# Patient Record
Sex: Male | Born: 1955 | Race: White | Hispanic: No | Marital: Married | Smoking: Never smoker
Health system: Southern US, Community
[De-identification: ages and names within clinical notes are randomized; demographics above are authoritative.]

## PROBLEM LIST (undated history)

## (undated) DIAGNOSIS — M1990 Unspecified osteoarthritis, unspecified site: Secondary | ICD-10-CM

## (undated) DIAGNOSIS — K635 Polyp of colon: Secondary | ICD-10-CM

## (undated) DIAGNOSIS — K219 Gastro-esophageal reflux disease without esophagitis: Secondary | ICD-10-CM

## (undated) HISTORY — PX: COLONOSCOPY: SHX174

## (undated) HISTORY — PX: CHOLECYSTECTOMY: SHX55

---

## 2005-10-18 ENCOUNTER — Ambulatory Visit: Payer: Self-pay | Admitting: Gastroenterology

## 2007-03-26 ENCOUNTER — Ambulatory Visit: Payer: Self-pay | Admitting: Physician Assistant

## 2007-07-16 ENCOUNTER — Ambulatory Visit: Payer: Self-pay | Admitting: Internal Medicine

## 2008-04-19 ENCOUNTER — Ambulatory Visit: Payer: Self-pay | Admitting: Internal Medicine

## 2010-09-28 ENCOUNTER — Ambulatory Visit: Payer: Self-pay | Admitting: Physician Assistant

## 2010-10-02 ENCOUNTER — Other Ambulatory Visit: Payer: Self-pay | Admitting: Sports Medicine

## 2012-04-24 ENCOUNTER — Ambulatory Visit: Payer: Self-pay | Admitting: Rheumatology

## 2012-05-04 ENCOUNTER — Ambulatory Visit: Payer: Self-pay | Admitting: Gastroenterology

## 2016-09-25 ENCOUNTER — Ambulatory Visit
Admission: EM | Admit: 2016-09-25 | Discharge: 2016-09-25 | Disposition: A | Discharge status: Home / Self Care | Payer: 59 | Attending: Family Medicine | Admitting: Family Medicine

## 2016-09-25 DIAGNOSIS — H00011 Hordeolum externum right upper eyelid: Secondary | ICD-10-CM | POA: Diagnosis not present

## 2016-09-25 DIAGNOSIS — H00031 Abscess of right upper eyelid: Secondary | ICD-10-CM | POA: Diagnosis not present

## 2016-09-25 DIAGNOSIS — H00033 Abscess of eyelid right eye, unspecified eyelid: Secondary | ICD-10-CM

## 2016-09-25 MED ORDER — CEPHALEXIN 500 MG PO CAPS: 500.0000 mg | ORAL_CAPSULE | Freq: Three times a day (TID) | ORAL | 0 refills | Status: DC

## 2016-09-25 NOTE — ED Triage Notes (Signed)
Patient complains of right eye pain that occurred after mowing his grass on Friday pm. Patient states that eye has been bothering him and he has been noticing swelling getting worse. Patient states he has been trying warm compresses and pollen eye drops.

## 2016-09-25 NOTE — ED Provider Notes (Signed)
[facility]    CSN: [id number] Arrival date & time: 09/25/16  1201     History   Chief Complaint Chief Complaint  Patient presents with  . Eye Pain    right    HPI Jose Moran is a 61 y.o. male.   The history is provided by the patient.  Eye Pain  Pertinent negatives include no headaches.  Eye Problem  Location:  Right eye Quality:  Aching Severity:  Moderate Onset quality:  Sudden Duration:  5 days Timing:  Constant Progression:  Worsening Chronicity:  New Context: not burn, not chemical exposure, not contact lens problem, not direct trauma, not foreign body, not using machinery, not scratch, not smoke exposure and not UV exposure   Relieved by:  Nothing Ineffective treatments:  Flushing and eye drops Associated symptoms: redness and swelling   Associated symptoms: no blurred vision, no crusting, no decreased vision, no discharge, no double vision, no facial rash, no headaches, no inflammation, no itching, no nausea, no numbness, no photophobia, no scotomas, no tearing, no tingling, no vomiting and no weakness   Risk factors: no conjunctival hemorrhage, no exposure to pinkeye, no previous injury to eye, no recent herpes zoster and no recent URI     History reviewed. No pertinent past medical history.  There are no active problems to display for this patient.   Past Surgical History:  Procedure Laterality Date  . CHOLECYSTECTOMY         Home Medications    Prior to Admission medications   Medication Sig Start Date End Date Taking? Authorizing Provider  aspirin EC 81 MG tablet Take 81 mg by mouth daily.   Yes [provider]  Multiple Vitamins-Minerals (MULTIVITAMIN WITH MINERALS) tablet Take 1 tablet by mouth daily.   Yes [provider]  cephALEXin (KEFLEX) 500 MG capsule Take 1 capsule (500 mg total) by mouth 3 (three) times daily. 09/25/16   Norval Gable, MD    Family History History reviewed. No pertinent family  history.  Social History Social History  Substance Use Topics  . Smoking status: Never Smoker  . Smokeless tobacco: Never Used  . Alcohol use Yes     Comment: rarely     Allergies   Patient has no known allergies.   Review of Systems Review of Systems  Eyes: Positive for pain and redness. Negative for blurred vision, double vision, photophobia, discharge and itching.  Gastrointestinal: Negative for nausea and vomiting.  Neurological: Negative for tingling, weakness, numbness and headaches.     Physical Exam Triage Vital Signs ED Triage Vitals  Enc Vitals Group     BP 09/25/16 1235 122/73     Pulse Rate 09/25/16 1235 73     Resp 09/25/16 1235 18     Temp 09/25/16 1235 97.6 F (36.4 C)     Temp Source 09/25/16 1235 Oral     SpO2 09/25/16 1235 100 %     Weight 09/25/16 1233 175 lb (79.4 kg)     Height 09/25/16 1233 5\' 11"  (1.803 m)     Head Circumference --      Peak Flow --      Pain Score 09/25/16 1233 1     Pain Loc --      Pain Edu? --      Excl. in [place]? --    No data found.   Updated Vital Signs BP 122/73 (BP Location: Left Arm)   Pulse 73   Temp 97.6 F (36.4  C) (Oral)   Resp 18   Ht 5\' 11"  (1.803 m)   Wt 175 lb (79.4 kg)   SpO2 100%   BMI 24.41 kg/m   Visual Acuity Right Eye Distance: 20/20 (uncorrected) Left Eye Distance: 20/20 (uncorrected) Bilateral Distance: 20/13 (uncorrected)  Right Eye Near:   Left Eye Near:    Bilateral Near:     Physical Exam  Constitutional: He appears well-developed and well-nourished. No distress.  HENT:  Head: Normocephalic and atraumatic.  Eyes: Conjunctivae and EOM are normal. Pupils are equal, round, and reactive to light. Right eye exhibits hordeolum.    Right upper eyelid erythema, edema and tenderness  Skin: He is not diaphoretic.  Nursing note and vitals reviewed.    UC Treatments / Results  Labs (all labs ordered are listed, but only abnormal results are displayed) Labs Reviewed - No data to  display  EKG  EKG Interpretation None       Radiology No results found.  Procedures Procedures (including critical care time)  Medications Ordered in UC Medications - No data to display   Initial Impression / Assessment and Plan / UC Course  I have reviewed the triage vital signs and the nursing notes.  Pertinent labs & imaging results that were available during my care of the patient were reviewed by me and considered in my medical decision making (see chart for details).       Final Clinical Impressions(s) / UC Diagnoses   Final diagnoses:  Hordeolum externum of right upper eyelid  Cellulitis of right eyelid    New Prescriptions Discharge Medication List as of 09/25/2016  2:12 PM    START taking these medications   Details  cephALEXin (KEFLEX) 500 MG capsule Take 1 capsule (500 mg total) by mouth 3 (three) times daily., Starting Wed 09/25/2016, Normal       1. diagnosis reviewed with patient 2. rx as per orders above; reviewed possible side effects, interactions, risks and benefits  3. Recommend supportive treatment with warm compresses 4. Follow-up prn if symptoms worsen or don't improve   Norval Gable, MD 09/25/16 1420

## 2017-09-02 ENCOUNTER — Other Ambulatory Visit: Payer: Self-pay | Admitting: Gastroenterology

## 2017-09-02 DIAGNOSIS — Z860101 Personal history of adenomatous and serrated colon polyps: Secondary | ICD-10-CM | POA: Insufficient documentation

## 2017-09-02 DIAGNOSIS — R1310 Dysphagia, unspecified: Secondary | ICD-10-CM

## 2017-09-05 ENCOUNTER — Ambulatory Visit
Admission: RE | Admit: 2017-09-05 | Discharge: 2017-09-05 | Disposition: A | Discharge status: Home / Self Care | Payer: 59 | Source: Ambulatory Visit | Attending: Gastroenterology | Admitting: Gastroenterology

## 2017-09-05 DIAGNOSIS — K449 Diaphragmatic hernia without obstruction or gangrene: Secondary | ICD-10-CM | POA: Diagnosis not present

## 2017-09-05 DIAGNOSIS — R1310 Dysphagia, unspecified: Secondary | ICD-10-CM | POA: Diagnosis present

## 2017-11-03 ENCOUNTER — Encounter: Payer: Self-pay | Admitting: *Deleted

## 2017-11-04 ENCOUNTER — Encounter: Payer: Self-pay | Admitting: *Deleted

## 2017-11-04 ENCOUNTER — Ambulatory Visit: Payer: 59 | Admitting: Anesthesiology

## 2017-11-04 ENCOUNTER — Encounter
Admission: RE | Disposition: A | Discharge status: Home / Self Care | Payer: Self-pay | Source: Ambulatory Visit | Attending: Gastroenterology

## 2017-11-04 ENCOUNTER — Ambulatory Visit
Admission: RE | Admit: 2017-11-04 | Discharge: 2017-11-04 | Disposition: A | Discharge status: Home / Self Care | Payer: 59 | Source: Ambulatory Visit | Attending: Gastroenterology | Admitting: Gastroenterology

## 2017-11-04 DIAGNOSIS — K2950 Unspecified chronic gastritis without bleeding: Secondary | ICD-10-CM | POA: Diagnosis not present

## 2017-11-04 DIAGNOSIS — R1310 Dysphagia, unspecified: Secondary | ICD-10-CM | POA: Insufficient documentation

## 2017-11-04 DIAGNOSIS — K2960 Other gastritis without bleeding: Secondary | ICD-10-CM | POA: Diagnosis not present

## 2017-11-04 DIAGNOSIS — K219 Gastro-esophageal reflux disease without esophagitis: Secondary | ICD-10-CM | POA: Insufficient documentation

## 2017-11-04 DIAGNOSIS — D126 Benign neoplasm of colon, unspecified: Secondary | ICD-10-CM | POA: Diagnosis not present

## 2017-11-04 DIAGNOSIS — K317 Polyp of stomach and duodenum: Secondary | ICD-10-CM | POA: Insufficient documentation

## 2017-11-04 DIAGNOSIS — Z800 Family history of malignant neoplasm of digestive organs: Secondary | ICD-10-CM | POA: Insufficient documentation

## 2017-11-04 DIAGNOSIS — Z86010 Personal history of colonic polyps: Secondary | ICD-10-CM | POA: Insufficient documentation

## 2017-11-04 DIAGNOSIS — K2980 Duodenitis without bleeding: Secondary | ICD-10-CM | POA: Diagnosis not present

## 2017-11-04 DIAGNOSIS — Z7982 Long term (current) use of aspirin: Secondary | ICD-10-CM | POA: Insufficient documentation

## 2017-11-04 HISTORY — DX: Unspecified osteoarthritis, unspecified site: M19.90

## 2017-11-04 HISTORY — DX: Polyp of colon: K63.5

## 2017-11-04 HISTORY — PX: ESOPHAGOGASTRODUODENOSCOPY (EGD) WITH PROPOFOL: SHX5813

## 2017-11-04 HISTORY — DX: Gastro-esophageal reflux disease without esophagitis: K21.9

## 2017-11-04 HISTORY — PX: COLONOSCOPY WITH PROPOFOL: SHX5780

## 2017-11-04 SURGERY — ESOPHAGOGASTRODUODENOSCOPY (EGD) WITH PROPOFOL
Anesthesia: General

## 2017-11-04 MED ORDER — PROPOFOL 500 MG/50ML IV EMUL
INTRAVENOUS | Status: DC | PRN
  Administered 2017-11-04: 150 ug/kg/min via INTRAVENOUS

## 2017-11-04 MED ORDER — PROPOFOL 500 MG/50ML IV EMUL
INTRAVENOUS | Status: AC
  Filled 2017-11-04: qty 50

## 2017-11-04 MED ORDER — LIDOCAINE HCL (PF) 2 % IJ SOLN
INTRAMUSCULAR | Status: AC
  Filled 2017-11-04: qty 10

## 2017-11-04 MED ORDER — GLYCOPYRROLATE 0.2 MG/ML IJ SOLN
INTRAMUSCULAR | Status: AC
  Filled 2017-11-04: qty 1

## 2017-11-04 MED ORDER — SODIUM CHLORIDE 0.9 % IV SOLN
INTRAVENOUS | Status: DC
  Administered 2017-11-04: 1000 mL via INTRAVENOUS

## 2017-11-04 MED ORDER — LIDOCAINE HCL (CARDIAC) PF 100 MG/5ML IV SOSY
PREFILLED_SYRINGE | INTRAVENOUS | Status: DC | PRN
  Administered 2017-11-04: 100 mg via INTRAVENOUS

## 2017-11-04 MED ORDER — GLYCOPYRROLATE 0.2 MG/ML IJ SOLN
INTRAMUSCULAR | Status: DC | PRN
  Administered 2017-11-04: 0.2 mg via INTRAVENOUS

## 2017-11-04 MED ORDER — PROPOFOL 10 MG/ML IV BOLUS
INTRAVENOUS | Status: AC
  Filled 2017-11-04: qty 20

## 2017-11-04 MED ORDER — PROPOFOL 10 MG/ML IV BOLUS
INTRAVENOUS | Status: AC
Start: 2017-11-04 — End: ?
  Filled 2017-11-04: qty 20

## 2017-11-04 MED ORDER — SODIUM CHLORIDE 0.9 % IV SOLN: INTRAVENOUS | Status: DC

## 2017-11-04 MED ORDER — PROPOFOL 10 MG/ML IV BOLUS
INTRAVENOUS | Status: DC | PRN
  Administered 2017-11-04: 80 mg via INTRAVENOUS
  Administered 2017-11-04: 30 mg via INTRAVENOUS

## 2017-11-04 NOTE — Op Note (Signed)
[facility] Gastroenterology Patient Name: Jose Moran Procedure Date: 11/04/2017 7:36 AM MRN: [id number] Account #: [id number] Date of Birth: [birth year] Admit Type: Outpatient Age: 62 Room: [facility] ENDO ROOM 3 Gender: Male Note Status: Finalized Procedure:            Colonoscopy Indications:          Family history of colon cancer in a first-degree                        relative, Personal history of colonic polyps Providers:            Lollie Sails, MD Referring MD:         Leonie Douglas. Doy Hutching, MD (Referring MD) Medicines:            Monitored Anesthesia Care Complications:        No immediate complications. Procedure:            Pre-Anesthesia Assessment:                       - ASA Grade Assessment: II - A patient with mild                        systemic disease.                       After obtaining informed consent, the colonoscope was                        passed under direct vision. Throughout the procedure,                        the patient's blood pressure, pulse, and oxygen                        saturations were monitored continuously. The                        Colonoscope was introduced through the anus and                        advanced to the the cecum, identified by appendiceal                        orifice and ileocecal valve. The colonoscopy was                        performed without difficulty. The patient tolerated the                        procedure well. The quality of the bowel preparation                        was good. Findings:      The colon (entire examined portion) appeared normal.      The retroflexed view of the distal rectum and anal verge was normal and       showed no anal or rectal abnormalities.      The digital rectal exam was normal. Impression:           - The entire examined colon is normal.                       -  The distal rectum and anal verge are normal on                        retroflexion view.                   - No specimens collected. Recommendation:       - Discharge patient to home.                       - Repeat colonoscopy in 5 years for screening purposes. Procedure Code(s):    --- Professional ---                       [phone], Colonoscopy, flexible; diagnostic, including                        collection of specimen(s) by brushing or washing, when                        performed (separate procedure) Diagnosis Code(s):    --- Professional ---                       Z80.0, Family history of malignant neoplasm of                        digestive organs                       Z86.010, Personal history of colonic polyps CPT copyright 2017 American Medical Association. All rights reserved. The codes documented in this report are preliminary and upon coder review may  be revised to meet current compliance requirements. Lollie Sails, MD 11/04/2017 8:24:13 AM This report has been signed electronically. Number of Addenda: 0 Note Initiated On: 11/04/2017 7:36 AM Scope Withdrawal Time: 0 hours 5 minutes 38 seconds  Total Procedure Duration: 0 hours 12 minutes 0 seconds       [facility]

## 2017-11-04 NOTE — Anesthesia Postprocedure Evaluation (Signed)
Anesthesia Post Note  Patient: Jose Moran  Procedure(s) Performed: ESOPHAGOGASTRODUODENOSCOPY (EGD) WITH PROPOFOL (N/A ) COLONOSCOPY WITH PROPOFOL (N/A )  Patient location during evaluation: Endoscopy Anesthesia Type: General Level of consciousness: awake and alert Pain management: pain level controlled Vital Signs Assessment: post-procedure vital signs reviewed and stable Respiratory status: spontaneous breathing, nonlabored ventilation, respiratory function stable and patient connected to nasal cannula oxygen Cardiovascular status: blood pressure returned to baseline and stable Postop Assessment: no apparent nausea or vomiting Anesthetic complications: no     Last Vitals:  Vitals:   11/04/17 0840 11/04/17 0850  BP: 115/81 126/81  Pulse: 64 64  Resp: 15 16  Temp:    SpO2: 100% 100%    Last Pain:  Vitals:   11/04/17 0820  TempSrc: Tympanic  PainSc:                  Martha Clan

## 2017-11-04 NOTE — Anesthesia Post-op Follow-up Note (Signed)
Anesthesia QCDR form completed.        

## 2017-11-04 NOTE — Op Note (Signed)
[facility] Gastroenterology Patient Name: Jose Moran Procedure Date: 11/04/2017 7:37 AM MRN: [id number] Account #: [id number] Date of Birth: 1956-03-21 Admit Type: Outpatient Age: 62 Room: [facility] ENDO ROOM 3 Gender: Male Note Status: Finalized Procedure:            Upper GI endoscopy Indications:          Dysphagia Providers:            Lollie Sails, MD Referring MD:         Leonie Douglas. Doy Hutching, MD (Referring MD) Medicines:            Monitored Anesthesia Care Complications:        No immediate complications. Procedure:            Pre-Anesthesia Assessment:                       - ASA Grade Assessment: II - A patient with mild                        systemic disease.                       After obtaining informed consent, the endoscope was                        passed under direct vision. Throughout the procedure,                        the patient's blood pressure, pulse, and oxygen                        saturations were monitored continuously. The Endoscope                        was introduced through the mouth, and advanced to the                        third part of duodenum. The upper GI endoscopy was                        accomplished without difficulty. The patient tolerated                        the procedure well. Findings:      LA Grade B (one or more mucosal breaks greater than 5 mm, not extending       between the tops of two mucosal folds) esophagitis with no bleeding was       found. Biopsies were taken with a cold forceps for histology.      The exam of the esophagus was otherwise normal, no evidence of of       stenosis, ring or stricture, or other lesions.      Patchy mild inflammation characterized by erosions and erythema was       found in the gastric antrum. Biopsies were taken with a cold forceps for       histology. Biopsies were taken with a cold forceps for Helicobacter       pylori testing.      Mild inflammation  characterized by congestion (edema) and erosions was       found in the duodenal bulb.  The cardia and gastric fundus were normal on retroflexion, note small       hiatal hernia. Impression:           - LA Grade B erosive esophagitis. Biopsied.                       - Erosive gastritis. Biopsied.                       - Duodenitis. Recommendation:       - Use Protonix (pantoprazole) 40 mg PO daily daily.                       - Await pathology results.                       - Return to GI clinic in 6 weeks. Procedure Code(s):    --- Professional ---                       [phone], Esophagogastroduodenoscopy, flexible, transoral;                        with biopsy, single or multiple Diagnosis Code(s):    --- Professional ---                       K20.8, Other esophagitis                       K29.60, Other gastritis without bleeding                       K29.80, Duodenitis without bleeding                       R13.10, Dysphagia, unspecified CPT copyright 2017 American Medical Association. All rights reserved. The codes documented in this report are preliminary and upon coder review may  be revised to meet current compliance requirements. Lollie Sails, MD 11/04/2017 8:06:30 AM This report has been signed electronically. Number of Addenda: 0 Note Initiated On: 11/04/2017 7:37 AM      [employer]

## 2017-11-04 NOTE — Anesthesia Preprocedure Evaluation (Addendum)
Anesthesia Evaluation  Patient identified by MRN, date of birth, ID band Patient awake    Reviewed: Allergy & Precautions, H&P , NPO status , Patient's Chart, lab work & pertinent test results, reviewed documented beta blocker date and time   Airway Mallampati: I  TM Distance: >3 FB Neck ROM: full    Dental  (+) Dental Advidsory Given, Caps, Teeth Intact   Pulmonary neg pulmonary ROS,           Cardiovascular Exercise Tolerance: Good negative cardio ROS       Neuro/Psych negative neurological ROS  negative psych ROS   GI/Hepatic Neg liver ROS, GERD  ,  Endo/Other  negative endocrine ROS  Renal/GU negative Renal ROS  negative genitourinary   Musculoskeletal   Abdominal   Peds  Hematology negative hematology ROS (+)   Anesthesia Other Findings Past Medical History: No date: Arthritis No date: Colon polyp No date: GERD (gastroesophageal reflux disease)   Reproductive/Obstetrics negative OB ROS                            Anesthesia Physical Anesthesia Plan  ASA: II  Anesthesia Plan: General   Post-op Pain Management:    Induction: Intravenous  PONV Risk Score and Plan: 2 and Propofol infusion  Airway Management Planned: Nasal Cannula  Additional Equipment:   Intra-op Plan:   Post-operative Plan:   Informed Consent: I have reviewed the patients History and Physical, chart, labs and discussed the procedure including the risks, benefits and alternatives for the proposed anesthesia with the patient or authorized representative who has indicated his/her understanding and acceptance.   Dental Advisory Given  Plan Discussed with: Anesthesiologist, CRNA and Surgeon  Anesthesia Plan Comments:         Anesthesia Quick Evaluation

## 2017-11-04 NOTE — H&P (Signed)
Outpatient short stay form Pre-procedure 11/04/2017 7:24 AM Lollie Sails MD  Primary Physician: Dr. Fulton Reek  Reason for visit: EGD and colonoscopy  History of present illness: Patient is a 62 year old male presenting today as above.  He has a complaint of some dysphagia is a family history of colon cancer primary relative and personal history of adenomatous colon polyps.  Has had a hearing swallow that was relatively normal with the exception of a small hernia however on looking at these films he has some prominent anterior cervical osteophytes as well and this is the region that he has his issue with.  No actual stenosis or stricture noted.  He does have a small hiatal hernia. He tolerated his prep well.  He takes no aspirin or blood thinning agent with the exception of 81 mg aspirin that has been held.   Current Facility-Administered Medications:  .  0.9 %  sodium chloride infusion, , Intravenous, Continuous, Lollie Sails, MD .  0.9 %  sodium chloride infusion, , Intravenous, Continuous, Lollie Sails, MD  Medications Prior to Admission  Medication Sig Dispense Refill Last Dose  . aspirin EC 81 MG tablet Take 81 mg by mouth daily.   Past Week at Unknown time  . Multiple Vitamins-Minerals (MULTIVITAMIN WITH MINERALS) tablet Take 1 tablet by mouth daily.   Past Week at Unknown time  . cephALEXin (KEFLEX) 500 MG capsule Take 1 capsule (500 mg total) by mouth 3 (three) times daily. (Patient not taking: Reported on 11/04/2017) 21 capsule 0 Completed Course at Unknown time     No Known Allergies   Past Medical History:  Diagnosis Date  . Arthritis   . Colon polyp   . GERD (gastroesophageal reflux disease)     Review of systems:      Physical Exam    Heart and lungs: Regular rate and rhythm without rub or gallop, lungs are bilaterally clear.    HEENT: Normocephalic atraumatic eyes are anicteric    Other:    Pertinant exam for procedure: Soft nontender  nondistended bowel sounds positive normoactive.    Planned proceedures: EGD, colonoscopy and indicated procedures. I have discussed the risks benefits and complications of procedures to include not limited to bleeding, infection, perforation and the risk of sedation and the patient wishes to proceed.    Lollie Sails, MD Gastroenterology 11/04/2017  7:24 AM

## 2017-11-04 NOTE — Transfer of Care (Signed)
Immediate Anesthesia Transfer of Care Note  Patient: Jose Moran  Procedure(s) Performed: ESOPHAGOGASTRODUODENOSCOPY (EGD) WITH PROPOFOL (N/A ) COLONOSCOPY WITH PROPOFOL (N/A )  Patient Location: Endoscopy Unit  Anesthesia Type:General  Level of Consciousness: sedated  Airway & Oxygen Therapy: Patient Spontanous Breathing and Patient connected to nasal cannula oxygen  Post-op Assessment: Report given to RN and Post -op Vital signs reviewed and stable  Post vital signs: Reviewed and stable  Last Vitals:  Vitals Value Taken Time  BP 88/68 11/04/2017  8:26 AM  Temp 36.5 C 11/04/2017  8:20 AM  Pulse 75 11/04/2017  8:30 AM  Resp 13 11/04/2017  8:30 AM  SpO2 99 % 11/04/2017  8:30 AM  Vitals shown include unvalidated device data.  Last Pain:  Vitals:   11/04/17 0820  TempSrc: Tympanic  PainSc:          Complications: No apparent anesthesia complications

## 2017-11-05 ENCOUNTER — Encounter: Payer: Self-pay | Admitting: Gastroenterology

## 2017-11-06 LAB — SURGICAL PATHOLOGY

## 2018-12-30 IMAGING — RF DG ESOPHAGUS
8 series · 12 of 12 positions shown · non-contrast
Comparison: CT 07/15/2017.

CLINICAL DATA: Dysphagia.

EXAM:
ESOPHOGRAM / BARIUM SWALLOW / BARIUM TABLET STUDY
TECHNIQUE: Combined double contrast and single contrast examination performed
using effervescent crystals, thick barium liquid, and thin barium
liquid. The patient was observed with fluoroscopy swallowing a 13 mm
barium sulphate tablet.
FLUOROSCOPY TIME:  Fluoroscopy Time:  0 minutes 54 seconds.
Radiation Exposure Index (if provided by the fluoroscopic device):
11.5 mGy
Number of Acquired Spot Images: 18

[Series 1: fluoro_barium 2fps_bw · 0.18mm/px · 3 of 7 frames shown (1 of 8)]
[frame 2/7]
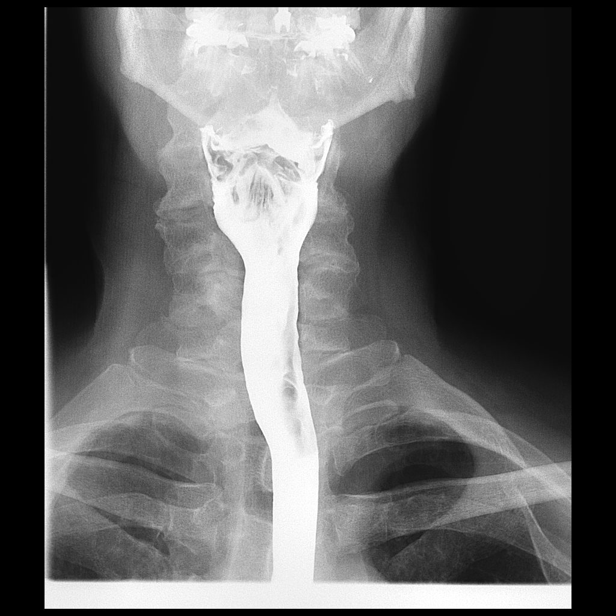
[frame 4/7]
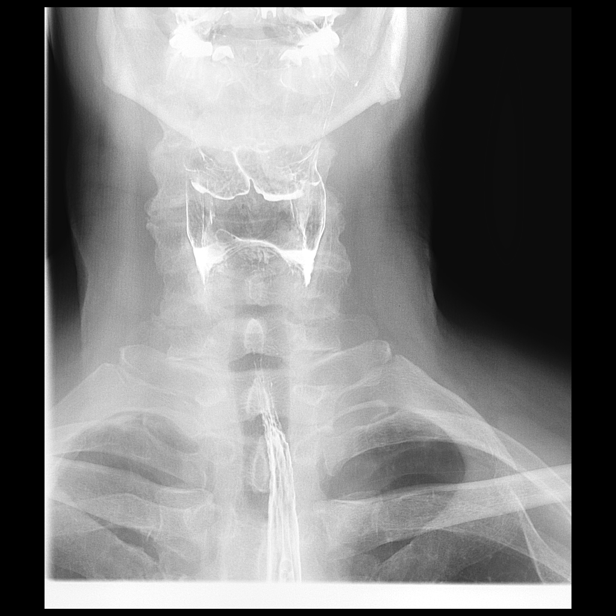
[frame 6/7]
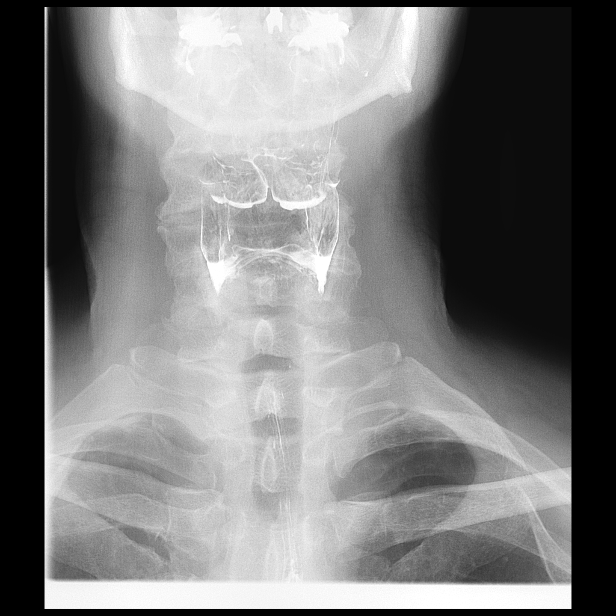

[Series 2: fluoro_barium 2fps_bw · 0.18mm/px · 3 of 5 frames shown (2 of 8)]
[frame 1/5]
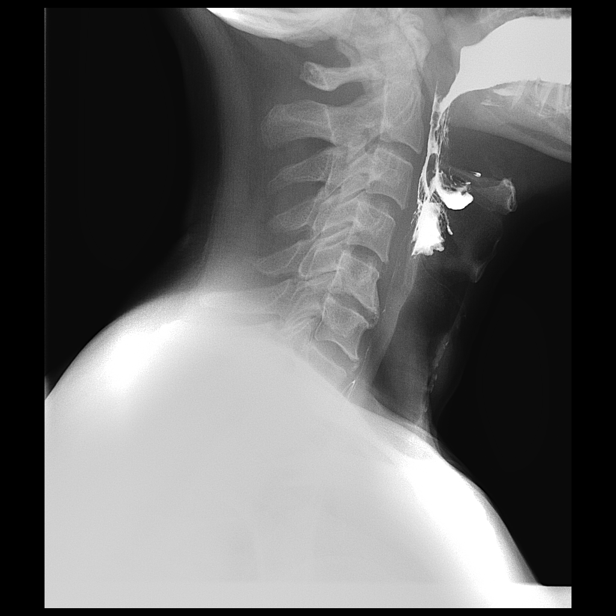
[frame 3/5]
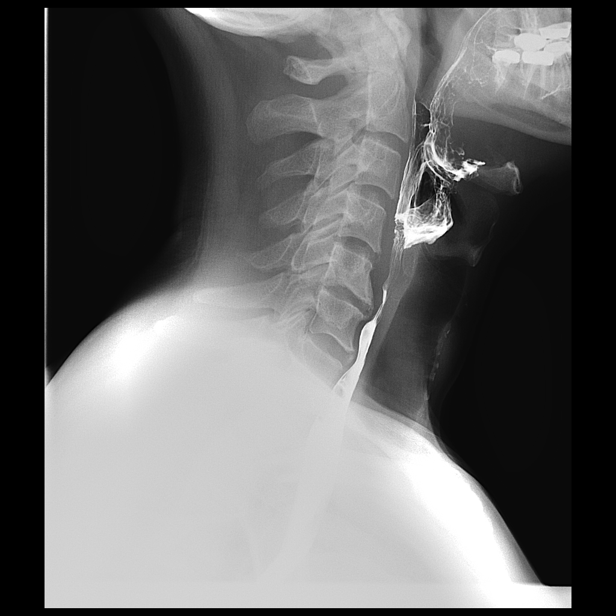
[frame 5/5]
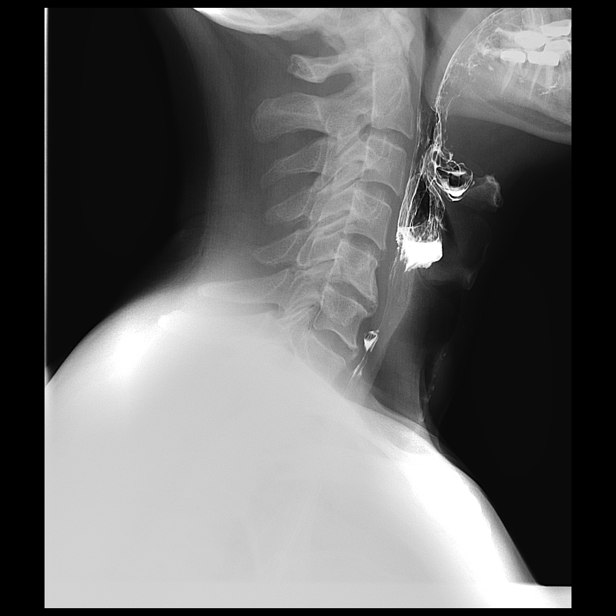

[Series 3: fluoro_barium 2fps_bw · 0.18mm/px · 1 of 1 slices shown (3 of 8)]
[im 1/1]
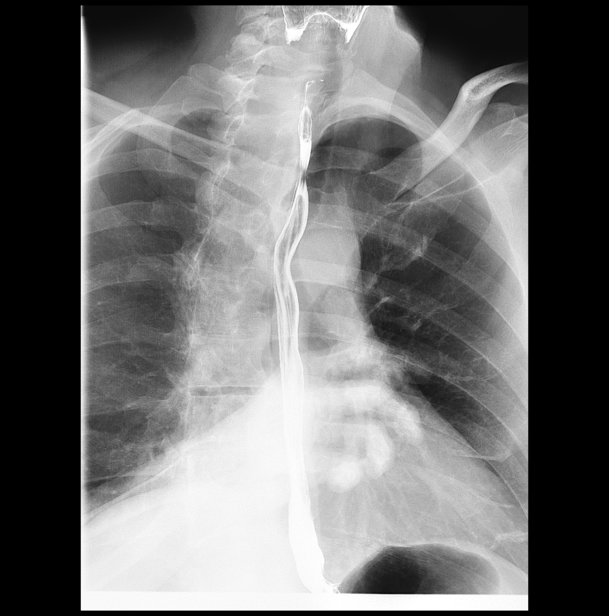

[Series 4: fluoro_barium 2fps_bw · 0.18mm/px · 1 of 1 slices shown (4 of 8)]
[im 1/1]
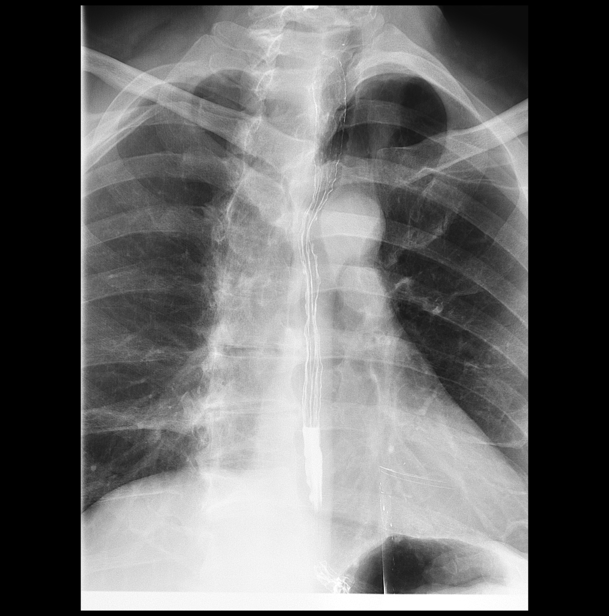

[Series 5: fluoro_barium 2fps_bw · 0.19mm/px · 1 of 1 slices shown (5 of 8)]
[im 1/1]
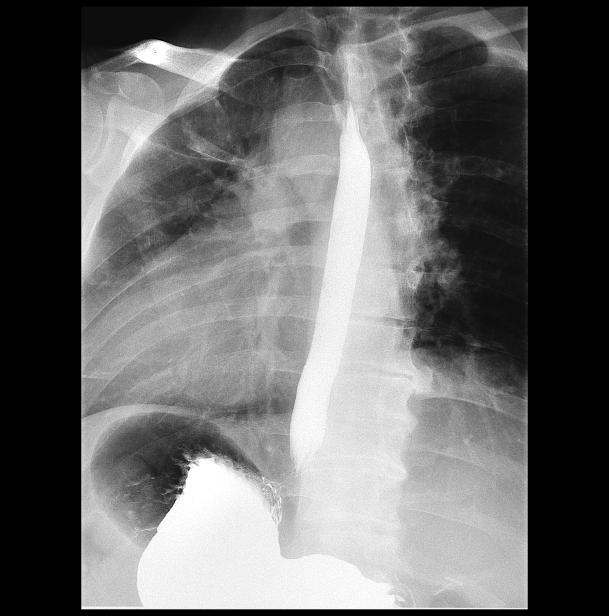

[Series 6: fluoro_barium 2fps_bw · 0.19mm/px · 1 of 1 slices shown (6 of 8)]
[im 1/1]
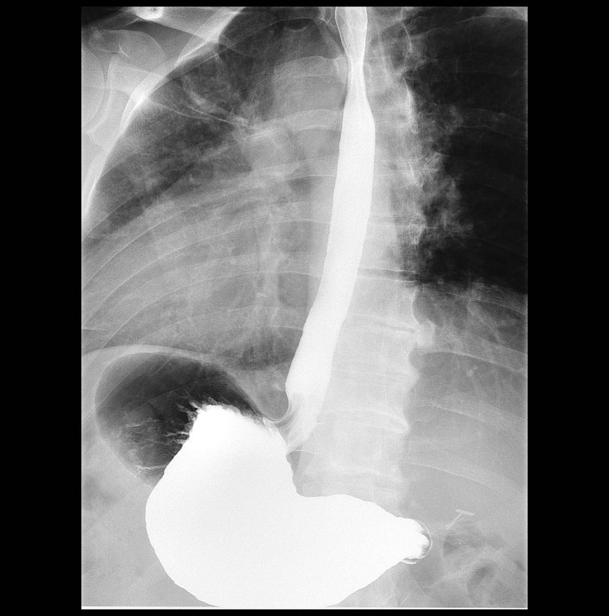

[Series 7: fluoro_barium 2fps_bw · 0.19mm/px · 1 of 1 slices shown (7 of 8)]
[im 1/1]
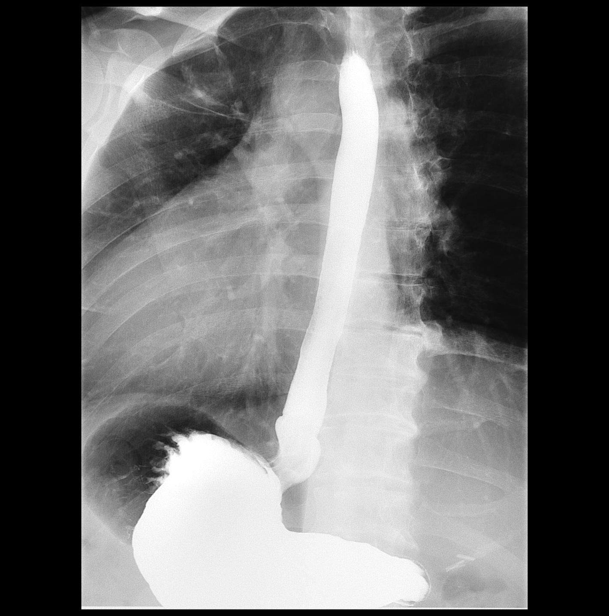

[Series 8: fluoro_barium 2fps_bw · 0.19mm/px · 1 of 1 slices shown (8 of 8)]
[im 1/1]
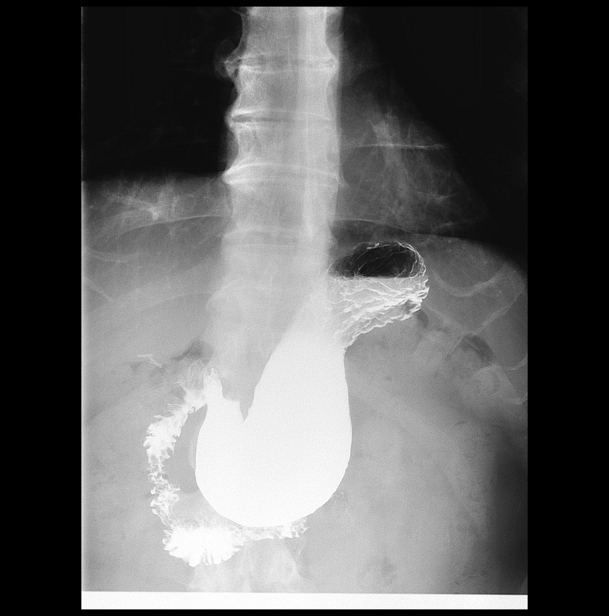

[12 of 12 positions shown; findings below may reference images not displayed]

FINDINGS: Cervical esophagus is normal. Small sliding hiatal hernia with a
minimal B ring noted. No evidence of obstruction. Peristalsis
normal. No reflux. Standardized barium tablet passed normally.
IMPRESSION: Tiny hiatal hernia otherwise negative exam. No obstructing
abnormality identified. No reflux noted.

## 2022-03-07 ENCOUNTER — Other Ambulatory Visit: Payer: Self-pay

## 2022-03-07 DIAGNOSIS — R1310 Dysphagia, unspecified: Secondary | ICD-10-CM

## 2022-03-11 ENCOUNTER — Ambulatory Visit
Admission: RE | Admit: 2022-03-11 | Discharge: 2022-03-11 | Disposition: A | Discharge status: Home / Self Care | Payer: Medicare HMO | Source: Ambulatory Visit | Attending: Internal Medicine | Admitting: Internal Medicine

## 2022-03-11 DIAGNOSIS — R1310 Dysphagia, unspecified: Secondary | ICD-10-CM | POA: Diagnosis present

## 2023-03-07 DIAGNOSIS — Z8719 Personal history of other diseases of the digestive system: Secondary | ICD-10-CM | POA: Insufficient documentation

## 2023-09-09 ENCOUNTER — Ambulatory Visit (INDEPENDENT_AMBULATORY_CARE_PROVIDER_SITE_OTHER): Payer: Self-pay

## 2023-09-09 DIAGNOSIS — D124 Benign neoplasm of descending colon: Secondary | ICD-10-CM | POA: Diagnosis not present

## 2023-09-09 DIAGNOSIS — Z860100 Personal history of colon polyps, unspecified: Secondary | ICD-10-CM | POA: Diagnosis not present

## 2023-09-09 DIAGNOSIS — Z09 Encounter for follow-up examination after completed treatment for conditions other than malignant neoplasm: Secondary | ICD-10-CM | POA: Diagnosis present

## 2023-09-09 DIAGNOSIS — K640 First degree hemorrhoids: Secondary | ICD-10-CM | POA: Diagnosis not present

## 2024-01-05 ENCOUNTER — Ambulatory Visit
Admission: EM | Admit: 2024-01-05 | Discharge: 2024-01-05 | Disposition: A | Discharge status: Home / Self Care | Attending: Family Medicine | Admitting: Family Medicine

## 2024-01-05 DIAGNOSIS — L03116 Cellulitis of left lower limb: Secondary | ICD-10-CM | POA: Diagnosis not present

## 2024-01-05 MED ORDER — DOXYCYCLINE HYCLATE 100 MG PO CAPS: 100.0000 mg | ORAL_CAPSULE | Freq: Two times a day (BID) | ORAL | 0 refills | Status: AC

## 2024-01-05 NOTE — Discharge Instructions (Signed)
 Stop by the pharmacy to pick up your prescriptions.  Follow up with your primary care provider or return to the urgent care, if not improving.

## 2024-01-05 NOTE — ED Triage Notes (Signed)
 Pt c/o L lower leg redness & tenderness x10 days. States he scrapped it against concrete stoop. Has tried OTC creams w/o relief.

## 2024-01-05 NOTE — ED Provider Notes (Signed)
 [facility]    CSN: [id number] Arrival date & time: 01/05/24  1413      History   Chief Complaint Chief Complaint  Patient presents with   Leg Problem    HPI Jose Moran is a 68 y.o. male.   HPI  Jose Moran presents for leg injury that occurred 9-10 days ago. He scrapped it on a concrete stump.  He cleaned it with soap and water and covered with a band-aid. The leg wound is getting worse.  Has itnermittent throbb in his foot.  There is been no new products including soaps and detergents.  No eye irritation, sore throat, difficulty breathing, nausea, vomiting or diarrhea.  Denies belly pain, joint pain and fever.  There has been no medication changes or new supplements.  Denies any new foods or drinks.   4.5 cm x 3 cm circyular lesion with cnetral eschar   ***Redness, pain, swelling ***Treatments tried ***Previous symptoms ***Insect or bug bites  Fever : no Vision changes: No Sore throat: no   Shortness of breath: no Rhinorrhea: no Appetite: normal  Hydration: normal  Abdominal pain: no Nausea: no Vomiting: no Diarrhea: no Dysuria: no  Sleep disturbance: no Arthralgias: no Headache: no   Past Medical History:  Diagnosis Date   Arthritis    Colon polyp    GERD (gastroesophageal reflux disease)     Patient Active Problem List   Diagnosis Date Noted   History of gastritis 03/07/2023   Hx of adenomatous colonic polyps 09/02/2017    Past Surgical History:  Procedure Laterality Date   CHOLECYSTECTOMY     COLONOSCOPY     COLONOSCOPY WITH PROPOFOL  N/A 11/04/2017   Procedure: COLONOSCOPY WITH PROPOFOL ;  Surgeon: Gaylyn Gladis PENNER, MD;  Location: [facility] ENDOSCOPY;  Service: Endoscopy;  Laterality: N/A;   ESOPHAGOGASTRODUODENOSCOPY (EGD) WITH PROPOFOL  N/A 11/04/2017   Procedure: ESOPHAGOGASTRODUODENOSCOPY (EGD) WITH PROPOFOL ;  Surgeon: Gaylyn Gladis PENNER, MD;  Location: [facility] ENDOSCOPY;  Service: Endoscopy;  Laterality: N/A;       Home  Medications    Prior to Admission medications   Medication Sig Start Date End Date Taking? Authorizing Provider  aspirin EC 81 MG tablet Take 81 mg by mouth daily.    [provider]  cephALEXin  (KEFLEX ) 500 MG capsule Take 1 capsule (500 mg total) by mouth 3 (three) times daily. Patient not taking: Reported on 11/04/2017 09/25/16   Servando Hire, MD  Multiple Vitamins-Minerals (MULTIVITAMIN WITH MINERALS) tablet Take 1 tablet by mouth daily.    [provider]    Family History Family History  Problem Relation Age of Onset   Colon cancer Father    Heart attack Father     Social History Social History   Tobacco Use   Smoking status: Never   Smokeless tobacco: Never  Vaping Use   Vaping status: Never Used  Substance Use Topics   Alcohol use: Yes    Comment: monthly or less    Drug use: No     Allergies   Patient has no known allergies.   Review of Systems Review of Systems :negative unless otherwise stated in HPI.      Physical Exam Triage Vital Signs ED Triage Vitals  Encounter Vitals Group     BP 01/05/24 1427 116/80     Girls Systolic BP Percentile --      Girls Diastolic BP Percentile --      Boys Systolic BP Percentile --      Boys Diastolic BP Percentile --  Pulse Rate 01/05/24 1427 80     Resp 01/05/24 1427 16     Temp 01/05/24 1427 98 F (36.7 C)     Temp Source 01/05/24 1427 Oral     SpO2 01/05/24 1427 97 %     Weight 01/05/24 1426 184 lb 11.2 oz (83.8 kg)     Height 01/05/24 1426 5' 11 (1.803 m)     Head Circumference --      Peak Flow --      Pain Score 01/05/24 1435 0     Pain Loc --      Pain Education --      Exclude from Growth Chart --    No data found.  Updated Vital Signs BP 116/80 (BP Location: Right Arm)   Pulse 80   Temp 98 F (36.7 C) (Oral)   Resp 16   Ht 5' 11 (1.803 m)   Wt 83.8 kg   SpO2 97%   BMI 25.76 kg/m   Visual Acuity Right Eye Distance:   Left Eye Distance:   Bilateral Distance:     Right Eye Near:   Left Eye Near:    Bilateral Near:     Physical Exam  GEN: alert, well appearing male, in no acute distress *** EYES: no scleral injection or discharge*** CV: regular rate and rhythm *** RESP: no increased work of breathing, clear to ascultation bilaterally*** MSK: no extremity edema *** NEURO: alert, moves all extremities appropriately SKIN: warm and dry; ***   ***pic  UC Treatments / Results  Labs (all labs ordered are listed, but only abnormal results are displayed) Labs Reviewed - No data to display  EKG   Radiology No results found.  Procedures Procedures (including critical care time)  Medications Ordered in UC Medications - No data to display  Initial Impression / Assessment and Plan / UC Course  I have reviewed the triage vital signs and the nursing notes.  Pertinent labs & imaging results that were available during my care of the patient were reviewed by me and considered in my medical decision making (see chart for details).     Patient is a 68 y.o. malewho presents for***.  Overall, patient is well-appearing and well-hydrated.  Vital signs stable.  Jose Moran is afebrile.  Exam concerning for ***.  Treat with***steroid ointment. ***No sign of infection to suggest antibiotics or antifungals at this time.  ***Not likely viral exanthem.   Contact Dermatitis Patient is a 68 y.o. male who presents for ***worsening rash for the ***.  Overall, patient is well-appearing and well-hydrated.  Vital signs stable.  Jose Moran is ***afebrile.  History and exam concerning for ***contact dermatitis.  ***Decadron 10 mg IM given.  Treat with ***prednisone taper and steroid ointment.  Claritin or Zyrtec twice a day for additional itch relief. No sign of infection to suggest antifungals or antibiotics at this time.    Reviewed expectations regarding course of current medical issues.  All questions asked were answered.  Outlined signs and symptoms  indicating need for more acute intervention. Patient verbalized understanding. After Visit Summary given.   Final Clinical Impressions(s) / UC Diagnoses   Final diagnoses:  None   Discharge Instructions   None    ED Prescriptions   None    PDMP not reviewed this encounter.
